# Patient Record
Sex: Female | Born: 1995 | Race: White | Hispanic: No | Marital: Married | Smoking: Never smoker
Health system: Southern US, Community
[De-identification: ages and names within clinical notes are randomized; demographics above are authoritative.]

## PROBLEM LIST (undated history)

## (undated) DIAGNOSIS — F909 Attention-deficit hyperactivity disorder, unspecified type: Secondary | ICD-10-CM

---
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
## 2016-01-08 ENCOUNTER — Encounter (HOSPITAL_BASED_OUTPATIENT_CLINIC_OR_DEPARTMENT_OTHER): Payer: Self-pay | Admitting: *Deleted

## 2016-01-08 ENCOUNTER — Emergency Department (HOSPITAL_BASED_OUTPATIENT_CLINIC_OR_DEPARTMENT_OTHER)
Admission: EM | Admit: 2016-01-08 | Discharge: 2016-01-08 | Disposition: A | Discharge status: Home / Self Care | Payer: 59 | Attending: Emergency Medicine | Admitting: Emergency Medicine

## 2016-01-08 DIAGNOSIS — N390 Urinary tract infection, site not specified: Secondary | ICD-10-CM

## 2016-01-08 DIAGNOSIS — Z79899 Other long term (current) drug therapy: Secondary | ICD-10-CM | POA: Diagnosis not present

## 2016-01-08 DIAGNOSIS — R112 Nausea with vomiting, unspecified: Secondary | ICD-10-CM | POA: Diagnosis present

## 2016-01-08 DIAGNOSIS — F909 Attention-deficit hyperactivity disorder, unspecified type: Secondary | ICD-10-CM | POA: Diagnosis not present

## 2016-01-08 LAB — URINALYSIS, ROUTINE W REFLEX MICROSCOPIC
Bilirubin Urine: NEGATIVE
GLUCOSE, UA: NEGATIVE mg/dL
Hgb urine dipstick: NEGATIVE
KETONES UR: NEGATIVE mg/dL
NITRITE: NEGATIVE
PH: 8.5 — AB (ref 5.0–8.0)
Protein, ur: 30 mg/dL — AB
SPECIFIC GRAVITY, URINE: 1.028 (ref 1.005–1.030)

## 2016-01-08 LAB — URINE MICROSCOPIC-ADD ON

## 2016-01-08 LAB — CBC
HEMATOCRIT: 42.8 % (ref 36.0–46.0)
HEMOGLOBIN: 15 g/dL (ref 12.0–15.0)
MCH: 30.4 pg (ref 26.0–34.0)
MCHC: 35 g/dL (ref 30.0–36.0)
MCV: 86.8 fL (ref 78.0–100.0)
Platelets: 304 10*3/uL (ref 150–400)
RBC: 4.93 MIL/uL (ref 3.87–5.11)
RDW: 13.1 % (ref 11.5–15.5)
WBC: 14.6 10*3/uL — ABNORMAL HIGH (ref 4.0–10.5)

## 2016-01-08 LAB — BASIC METABOLIC PANEL
Anion gap: 11 (ref 5–15)
BUN: 17 mg/dL (ref 6–20)
CHLORIDE: 105 mmol/L (ref 101–111)
CO2: 23 mmol/L (ref 22–32)
CREATININE: 0.55 mg/dL (ref 0.44–1.00)
Calcium: 9.1 mg/dL (ref 8.9–10.3)
GFR calc Af Amer: >60 mL/min (ref >60)
GFR calc non Af Amer: >60 mL/min (ref >60)
Glucose, Bld: 99 mg/dL (ref 65–99)
POTASSIUM: 4 mmol/L (ref 3.5–5.1)
SODIUM: 139 mmol/L (ref 135–145)

## 2016-01-08 LAB — PREGNANCY, URINE: Preg Test, Ur: NEGATIVE

## 2016-01-08 MED ORDER — SODIUM CHLORIDE 0.9 % IV BOLUS (SEPSIS)
1000.0000 mL | Freq: Once | INTRAVENOUS | Status: AC
  Administered 2016-01-08: 1000 mL via INTRAVENOUS

## 2016-01-08 MED ORDER — ONDANSETRON HCL 4 MG PO TABS: 4.0000 mg | ORAL_TABLET | Freq: Four times a day (QID) | ORAL | Status: AC

## 2016-01-08 MED ORDER — CIPROFLOXACIN IN D5W 400 MG/200ML IV SOLN
400.0000 mg | Freq: Once | INTRAVENOUS | Status: AC
  Administered 2016-01-08: 400 mg via INTRAVENOUS
  Filled 2016-01-08: qty 200

## 2016-01-08 MED ORDER — ACETAMINOPHEN 325 MG PO TABS
650.0000 mg | ORAL_TABLET | Freq: Once | ORAL | Status: AC
  Administered 2016-01-08: 650 mg via ORAL
  Filled 2016-01-08: qty 2

## 2016-01-08 MED ORDER — KETOROLAC TROMETHAMINE 30 MG/ML IJ SOLN
30.0000 mg | Freq: Once | INTRAMUSCULAR | Status: AC
  Administered 2016-01-08: 30 mg via INTRAVENOUS
  Filled 2016-01-08: qty 1

## 2016-01-08 MED ORDER — PHENAZOPYRIDINE HCL 200 MG PO TABS: 200.0000 mg | ORAL_TABLET | Freq: Three times a day (TID) | ORAL | Status: AC

## 2016-01-08 MED ORDER — CIPROFLOXACIN HCL 500 MG PO TABS: 500.0000 mg | ORAL_TABLET | Freq: Two times a day (BID) | ORAL | Status: AC

## 2016-01-08 MED ORDER — ONDANSETRON HCL 4 MG/2ML IJ SOLN
4.0000 mg | Freq: Once | INTRAMUSCULAR | Status: AC
  Administered 2016-01-08: 4 mg via INTRAVENOUS
  Filled 2016-01-08: qty 2

## 2016-01-08 MED ORDER — ONDANSETRON 8 MG PO TBDP: 8.0000 mg | ORAL_TABLET | Freq: Once | ORAL | Status: DC

## 2016-01-08 NOTE — Discharge Instructions (Signed)
Your symptoms are likely due to a urinary tract infection. Please take your antibiotics as prescribed. You may use your Pyridium for the urinary burning. Please stay well hydrated and drink lots of water and/or Gatorade. Return to ED for any new or worsening symptoms.  Urinary Tract Infection Urinary tract infections (UTIs) can develop anywhere along your urinary tract. Your urinary tract is your body's drainage system for removing wastes and extra water. Your urinary tract includes two kidneys, two ureters, a bladder, and a urethra. Your kidneys are a pair of bean-shaped organs. Each kidney is about the size of your fist. They are located below your ribs, one on each side of your spine. CAUSES Infections are caused by microbes, which are microscopic organisms, including fungi, viruses, and bacteria. These organisms are so small that they can only be seen through a microscope. Bacteria are the microbes that most commonly cause UTIs. SYMPTOMS  Symptoms of UTIs may vary by age and gender of the patient and by the location of the infection. Symptoms in young women typically include a frequent and intense urge to urinate and a painful, burning feeling in the bladder or urethra during urination. Older women and men are more likely to be tired, shaky, and weak and have muscle aches and abdominal pain. A fever may mean the infection is in your kidneys. Other symptoms of a kidney infection include pain in your back or sides below the ribs, nausea, and vomiting. DIAGNOSIS To diagnose a UTI, your caregiver will ask you about your symptoms. Your caregiver will also ask you to provide a urine sample. The urine sample will be tested for bacteria and white blood cells. White blood cells are made by your body to help fight infection. TREATMENT  Typically, UTIs can be treated with medication. Because most UTIs are caused by a bacterial infection, they usually can be treated with the use of antibiotics. The choice of  antibiotic and length of treatment depend on your symptoms and the type of bacteria causing your infection. HOME CARE INSTRUCTIONS  If you were prescribed antibiotics, take them exactly as your caregiver instructs you. Finish the medication even if you feel better after you have only taken some of the medication.  Drink enough water and fluids to keep your urine clear or pale yellow.  Avoid caffeine, tea, and carbonated beverages. They tend to irritate your bladder.  Empty your bladder often. Avoid holding urine for long periods of time.  Empty your bladder before and after sexual intercourse.  After a bowel movement, women should cleanse from front to back. Use each tissue only once. SEEK MEDICAL CARE IF:   You have back pain.  You develop a fever.  Your symptoms do not begin to resolve within 3 days. SEEK IMMEDIATE MEDICAL CARE IF:   You have severe back pain or lower abdominal pain.  You develop chills.  You have nausea or vomiting.  You have continued burning or discomfort with urination. MAKE SURE YOU:   Understand these instructions.  Will watch your condition.  Will get help right away if you are not doing well or get worse.   This information is not intended to replace advice given to you by your health care provider. Make sure you discuss any questions you have with your health care provider.   Document Released: 06/06/2005 Document Revised: 05/18/2015 Document Reviewed: 10/05/2011 Elsevier Interactive Patient Education [employer]2016 [employer]

## 2016-01-08 NOTE — ED Provider Notes (Signed)
CSN: [id number]     Arrival date & time 01/08/16  1408 History   First MD Initiated Contact with Patient 01/08/16 [phone]     Chief Complaint  Patient presents with  . Emesis     (Consider location/radiation/quality/duration/timing/severity/associated sxs/prior Treatment) HPI Kathryn Snow is a 20 y.o. female who comes in for evaluation of nausea, vomiting and dysuria. Patient reports yesterday she began to develop dysuria, urinary hesitancy. She reports symptoms worsen this morning at 3:00 AM at which time she developed nausea and vomiting. Emesis is nonbloody and nonbilious. She reports lower abdominal discomfort. Reports she is just now coming off of her menstrual cycle, which was normal for her. Denies any fevers at home. She does report that her family had the flu last week. She reports mild mid back pain. No other abdominal surgeries. No other alleviating or modifying factors.  Past Medical History  Diagnosis Date  . Attention deficit disorder with hyperactivity    History reviewed. No pertinent past surgical history. No family history on file. Social History  Substance Use Topics  . Smoking status: Never Smoker   . Smokeless tobacco: None  . Alcohol Use: No   OB History    No data available     Review of Systems A 10 point review of systems was completed and was negative except for pertinent positives and negatives as mentioned in the history of present illness     Allergies  Penicillins  Home Medications   Prior to Admission medications   Medication Sig Start Date End Date Taking? Authorizing Provider  Sertraline HCl (ZOLOFT PO) Take by mouth daily.   Yes Historical Provider, MD  acetaminophen (TYLENOL) 325 MG tablet Take 1,000 mg by mouth every 6 (six) hours as needed.    Historical Provider, MD  ciprofloxacin (CIPRO) 500 MG tablet Take 1 tablet (500 mg total) by mouth 2 (two) times daily. 01/08/16   Joycie Peek, PA-C  ondansetron (ZOFRAN) 4 MG tablet Take  1 tablet (4 mg total) by mouth every 6 (six) hours. 01/08/16   Joycie Peek, PA-C  OVER THE COUNTER MEDICATION Birth control pills    Historical Provider, MD  phenazopyridine (PYRIDIUM) 200 MG tablet Take 1 tablet (200 mg total) by mouth 3 (three) times daily. 01/08/16   Joycie Peek, PA-C  prochlorperazine (COMPAZINE) 10 MG tablet Take 10 mg by mouth every 6 (six) hours as needed for nausea or vomiting.    Historical Provider, MD   BP 110/46 mmHg  Pulse 105  Temp(Src) 98.6 F (37 C) (Oral)  Resp 15  Ht  (1.676 m)  Wt 90.719 kg  BMI 32.30 kg/m2  SpO2 99% Physical Exam  Constitutional: She is oriented to person, place, and time. She appears well-developed and well-nourished.  HENT:  Head: Normocephalic and atraumatic.  Mouth/Throat: Oropharynx is clear and moist.  Eyes: Conjunctivae are normal. Pupils are equal, round, and reactive to light. Right eye exhibits no discharge. Left eye exhibits no discharge. No scleral icterus.  Neck: Neck supple.  Cardiovascular: Normal rate, regular rhythm and normal heart sounds.   Pulmonary/Chest: Effort normal and breath sounds normal. No respiratory distress. She has no wheezes. She has no rales.  Abdominal: Soft.  Moderate suprapubic tenderness. Abdomen is otherwise soft, nondistended without rebound or guarding. No other lesions or deformities. No CVA tenderness. No rashes.  Musculoskeletal: She exhibits no tenderness.  Neurological: She is alert and oriented to person, place, and time.  Cranial Nerves II-XII grossly intact  Skin:  Skin is warm and dry. No rash noted.  Psychiatric: She has a normal mood and affect.  Nursing note and vitals reviewed.   ED Course  Procedures (including critical care time) Labs Review Labs Reviewed  URINALYSIS, ROUTINE W REFLEX MICROSCOPIC ([facility]) - Abnormal; Notable for the following:    pH 8.5 (*)    Protein, ur 30 (*)    Leukocytes, UA SMALL (*)    All other components within normal limits   CBC - Abnormal; Notable for the following:    WBC 14.6 (*)    All other components within normal limits  URINE MICROSCOPIC-ADD ON - Abnormal; Notable for the following:    Squamous Epithelial / LPF 0-5 (*)    Bacteria, UA MANY (*)    All other components within normal limits  URINE CULTURE  PREGNANCY, URINE  BASIC METABOLIC PANEL    Imaging Review No results found. I have personally reviewed and evaluated these images and lab results as part of my medical decision-making.   EKG Interpretation   Date/Time:  Sunday January 08 2016 15:27:39 EDT Ventricular Rate:  114 PR Interval:  129 QRS Duration: 84 QT Interval:  322 QTC Calculation: 443 R Axis:   56 Text Interpretation:  Sinus tachycardia Otherwise normal ECG No old  tracing to compare Confirmed by GOLDSTON  MD, SCOTT [phone]([id number]) on 01/08/2016  3:29:26 PM     Meds given in ED:  Medications  ondansetron (ZOFRAN) injection 4 mg (4 mg Intravenous Given 01/08/16 1453)  sodium chloride 0.9 % bolus 1,000 mL (0 mLs Intravenous Stopped 01/08/16 1618)  sodium chloride 0.9 % bolus 1,000 mL (0 mLs Intravenous Stopped 01/08/16 1800)  ciprofloxacin (CIPRO) IVPB 400 mg (0 mg Intravenous Stopped 01/08/16 1827)  ketorolac (TORADOL) 30 MG/ML injection 30 mg (30 mg Intravenous Given 01/08/16 1756)  acetaminophen (TYLENOL) tablet 650 mg (650 mg Oral Given 01/08/16 1755)  sodium chloride 0.9 % bolus 1,000 mL (1,000 mLs Intravenous New Bag/Given 01/08/16 1802)    New Prescriptions   CIPROFLOXACIN (CIPRO) 500 MG TABLET    Take 1 tablet (500 mg total) by mouth 2 (two) times daily.   ONDANSETRON (ZOFRAN) 4 MG TABLET    Take 1 tablet (4 mg total) by mouth every 6 (six) hours.   PHENAZOPYRIDINE (PYRIDIUM) 200 MG TABLET    Take 1 tablet (200 mg total) by mouth 3 (three) times daily.   Filed Vitals:   01/08/16 1637 01/08/16 1736 01/08/16 1916 01/08/16 1916  BP: 126/64  110/46   Pulse: 108  105   Temp:  100 F (37.8 C)  98.6 F (37 C)  TempSrc:  Oral   Oral  Resp: 18  15   Height:      Weight:      SpO2: 100%  99%     MDM  Pt has been diagnosed with a UTI. She does report some mild back pain but does not have any CVA tenderness. Patient does have mild tachycardia, given fluid bolus and she reports she feels better. Pt is afebrile, normotensive, and has had no nausea or vomiting in the emergency department. Tolerating oral fluids. However, we'll treat for possible early pyelonephritis. Urine culture obtained. Given Cipro IV and emergent department, will DC with oral medication. Also given nausea medication. Discussed aggressive oral rehydration. Pt to be dc home with antibiotics and instructions to follow up with PCP if symptoms persist. Discussed strict return precautions, patient and family bedside verbalize understanding and agree with plan. No  evidence of other acute emergent pathology at this time, appropriate for discharge.  Final diagnoses:  UTI (lower urinary tract infection)        Joycie Peek, PA-C 01/08/16 1952  Pricilla Loveless, MD 01/12/16 [phone]

## 2016-01-08 NOTE — ED Notes (Addendum)
Patient c/o vomiting & painful urination for the past 24 hours. She states she is dehydrated due to vomiting

## 2016-01-08 NOTE — ED Notes (Signed)
Pt and mother given d/c instructions as per chart. Verbalizes understanding. No questions. Rx x 3

## 2016-01-10 LAB — URINE CULTURE

## 2017-05-29 ENCOUNTER — Encounter (HOSPITAL_COMMUNITY): Payer: Self-pay | Admitting: *Deleted

## 2017-05-29 ENCOUNTER — Emergency Department (HOSPITAL_COMMUNITY)
Admission: EM | Admit: 2017-05-29 | Discharge: 2017-05-29 | Disposition: A | Discharge status: Home / Self Care | Payer: 59 | Attending: Physician Assistant | Admitting: Physician Assistant

## 2017-05-29 ENCOUNTER — Emergency Department (HOSPITAL_COMMUNITY): Payer: 59

## 2017-05-29 DIAGNOSIS — Y998 Other external cause status: Secondary | ICD-10-CM | POA: Insufficient documentation

## 2017-05-29 DIAGNOSIS — Z23 Encounter for immunization: Secondary | ICD-10-CM | POA: Diagnosis not present

## 2017-05-29 DIAGNOSIS — Y9389 Activity, other specified: Secondary | ICD-10-CM | POA: Diagnosis not present

## 2017-05-29 DIAGNOSIS — Z79899 Other long term (current) drug therapy: Secondary | ICD-10-CM | POA: Diagnosis not present

## 2017-05-29 DIAGNOSIS — S61412A Laceration without foreign body of left hand, initial encounter: Secondary | ICD-10-CM | POA: Diagnosis not present

## 2017-05-29 DIAGNOSIS — Y929 Unspecified place or not applicable: Secondary | ICD-10-CM | POA: Insufficient documentation

## 2017-05-29 DIAGNOSIS — W25XXXA Contact with sharp glass, initial encounter: Secondary | ICD-10-CM | POA: Diagnosis not present

## 2017-05-29 DIAGNOSIS — F909 Attention-deficit hyperactivity disorder, unspecified type: Secondary | ICD-10-CM | POA: Diagnosis not present

## 2017-05-29 MED ORDER — LIDOCAINE HCL 2 % IJ SOLN
10.0000 mL | Freq: Once | INTRAMUSCULAR | Status: DC
  Filled 2017-05-29: qty 20

## 2017-05-29 MED ORDER — OXYCODONE-ACETAMINOPHEN 5-325 MG PO TABS
ORAL_TABLET | ORAL | Status: AC
  Filled 2017-05-29: qty 1

## 2017-05-29 MED ORDER — TETANUS-DIPHTH-ACELL PERTUSSIS 5-2.5-18.5 LF-MCG/0.5 IM SUSP
0.5000 mL | Freq: Once | INTRAMUSCULAR | Status: AC
  Administered 2017-05-29: 0.5 mL via INTRAMUSCULAR
  Filled 2017-05-29: qty 0.5

## 2017-05-29 MED ORDER — LIDOCAINE VISCOUS 2 % MT SOLN
15.0000 mL | Freq: Once | OROMUCOSAL | Status: DC
  Filled 2017-05-29: qty 15

## 2017-05-29 MED ORDER — CLINDAMYCIN HCL 300 MG PO CAPS: 300.0000 mg | ORAL_CAPSULE | Freq: Four times a day (QID) | ORAL | 0 refills | Status: AC

## 2017-05-29 MED ORDER — OXYCODONE-ACETAMINOPHEN 5-325 MG PO TABS
1.0000 | ORAL_TABLET | ORAL | Status: DC | PRN
  Administered 2017-05-29: 1 via ORAL

## 2017-05-29 MED ORDER — CLINDAMYCIN HCL 300 MG PO CAPS: 300.0000 mg | ORAL_CAPSULE | Freq: Four times a day (QID) | ORAL | 0 refills | Status: DC

## 2017-05-29 NOTE — Discharge Instructions (Signed)
Please see the information and instructions below regarding your visit.  Your diagnoses today include:  1. Laceration of left hand without foreign body, initial encounter      Tests performed today include: See side panel of your discharge paperwork for testing performed today. Vital signs are listed at the bottom of these instructions.   Medications prescribed:    Clindamycin. Please take all of your antibiotics until finished.   You may develop abdominal discomfort or nausea from the antibiotic. If this occurs, you may take it with food. Some patients also get diarrhea with antibiotics. You may help offset this with probiotics which you can buy or get in yogurt. Do not eat or take the probiotics until 2 hours after your antibiotic. Some women develop vaginal yeast infections after antibiotics. If you develop unusual vaginal discharge after being on this medication, please see your primary care provider.   Some people develop allergies to antibiotics. Symptoms of antibiotic allergy can be mild and include a flat rash and itching. They can also be more serious and include:  ?Hives - Hives are raised, red patches of skin that are usually very itchy.  ?Lip or tongue swelling  ?Trouble swallowing or breathing  ?Blistering of the skin or mouth.  If you have any of these serious symptoms, please seek emergency medical care immediately.    Take any prescribed medications only as prescribed, and any over the counter medications only as directed on the packaging.  Home care instructions:  Please follow any educational materials contained in this packet.   Follow-up instructions: Please follow-up with your primary care provider for further evaluation of your symptoms if they are not completely improved.   Please follow up with orthopedics  Return instructions:  Please return to the Emergency Department if you experience worsening symptoms.  Please return if you have any other emergent  concerns.   Your vital signs today were: BP 138/84 (BP Location: Right Arm)    Pulse 94    Temp 98.4 F (36.9 C) (Oral)    Resp 18    LMP 05/28/2017 Comment: patient shielded   SpO2 98%  If your blood pressure (BP) was elevated on multiple readings during this visit above 130 for the top number or above 80 for the bottom number, please have this repeated by your primary care provider within one month. --------------  Thank you for allowing [place] to participate in your care today.

## 2017-05-29 NOTE — ED Triage Notes (Signed)
Pt reports falling and tried to break her fall on glass table. Has approx one inch laceration to left palm, bleeding controlled. Reports multiple small lacerations/abrasions to right hand and possibly her back.

## 2017-05-29 NOTE — ED Notes (Signed)
Patient transported to X-ray 

## 2017-05-29 NOTE — ED Provider Notes (Signed)
[facility]-EMERGENCY DEPT Provider Note   CSN: [id number] Arrival date & time: 05/29/17  1800     History   Chief Complaint Chief Complaint  Patient presents with  . Laceration    HPI Kathryn Snow is a 21 y.o. female.  HPI   Patient is a 21 year old female with no significant past medical history presenting for a laceration of her left palm following an incident of falling on a broken glass table. Patient per she was sitting on the table when it broke and she caught herself. Patient did not lose consciousness or hit her head. Patient had no presyncope, dizziness, or lightheadedness. Patient reports decreased sensation in her left thumb, but denies any weakness in her hand or bony tenderness. Patient does not recall when last tetanus shot was.  Past Medical History:  Diagnosis Date  . Attention deficit disorder with hyperactivity     There are no active problems to display for this patient.   History reviewed. No pertinent surgical history.  OB History    No data available       Home Medications    Prior to Admission medications   Medication Sig Start Date End Date Taking? Authorizing Provider  acetaminophen (TYLENOL) 325 MG tablet Take 1,000 mg by mouth every 6 (six) hours as needed.    [provider]  ciprofloxacin (CIPRO) 500 MG tablet Take 1 tablet (500 mg total) by mouth 2 (two) times daily. 01/08/16   Cartner, Sharlet Salina, PA-C  ondansetron (ZOFRAN) 4 MG tablet Take 1 tablet (4 mg total) by mouth every 6 (six) hours. 01/08/16   Cartner, Sharlet Salina, PA-C  OVER THE COUNTER MEDICATION Birth control pills    [provider]  phenazopyridine (PYRIDIUM) 200 MG tablet Take 1 tablet (200 mg total) by mouth 3 (three) times daily. 01/08/16   Cartner, Sharlet Salina, PA-C  prochlorperazine (COMPAZINE) 10 MG tablet Take 10 mg by mouth every 6 (six) hours as needed for nausea or vomiting.    [provider]  Sertraline HCl (ZOLOFT PO) Take by mouth daily.     [provider]    Family History History reviewed. No pertinent family history.  Social History Social History  Substance Use Topics  . Smoking status: Never Smoker  . Smokeless tobacco: Not on file  . Alcohol use No     Allergies   Penicillins   Review of Systems Review of Systems  Gastrointestinal: Positive for nausea. Negative for vomiting.  Musculoskeletal: Positive for joint swelling.  Skin: Positive for wound.  Neurological: Negative for dizziness, syncope and light-headedness.     Physical Exam Updated Vital Signs BP 138/84 (BP Location: Right Arm)   Pulse 94   Temp 98.4 F (36.9 C) (Oral)   Resp 18   LMP 05/28/2017 Comment: patient shielded  SpO2 98%   Physical Exam  Constitutional: She appears well-developed and well-nourished. No distress.  Sitting comfortably in bed.  HENT:  Head: Normocephalic and atraumatic.  Eyes: Conjunctivae are normal. Right eye exhibits no discharge. Left eye exhibits no discharge.  EOMs normal to gross examination.  Neck: Normal range of motion.  Cardiovascular: Normal rate and regular rhythm.   Intact, 2+ radial pulse.  Pulmonary/Chest:  Normal respiratory effort. Patient converses comfortably. No audible wheeze or stridor.  Abdominal: She exhibits no distension.  Musculoskeletal:  Patient has full range of motion of the left thumb. Examination of the left thumb after anesthetizing wound demonstrates no laxity of the ulnar collateral ligament with Abduction. Patient has strong  pincer grip of the left thumb and 5 out of 5 strength with resistance to flexion, extension, opposition, and abduction.  Neurological: She is alert.  Cranial nerves intact to gross observation. Patient moves extremities without difficulty.  Skin: Skin is warm and dry. She is not diaphoretic.  2 cm laceration of the left palm over the thenar eminence. No extension to the fascial layer. No foreign body identified on exploration. Multiple  superficial abrasions noted on bilateral hands.   Psychiatric: She has a normal mood and affect. Her behavior is normal. Judgment and thought content normal.  Nursing note and vitals reviewed.    ED Treatments / Results  Labs (all labs ordered are listed, but only abnormal results are displayed) Labs Reviewed - No data to display  EKG  EKG Interpretation None       Radiology Dg Hand Complete Left  Result Date: 05/29/2017 CLINICAL DATA:  Patient fell through glass table. Laceration to the left palm. Multiple lacerations to the right hand. EXAM: LEFT HAND - COMPLETE 3+ VIEW COMPARISON:  None. FINDINGS: There is no evidence of fracture or dislocation. There is no evidence of arthropathy or other focal bone abnormality. Soft tissues are unremarkable. No radiopaque foreign bodies identified. IMPRESSION: No acute bony abnormalities. No radiopaque soft tissue foreign bodies. Electronically Signed   By: Burman Nieves M.D.   On: 05/29/2017 21:09    Procedures .Marland KitchenLaceration Repair Date/Time: 05/29/2017 11:07 PM Performed by: Elisha Ponder Authorized by: Aviva Kluver B   Consent:    Consent obtained:  Verbal   Consent given by:  Patient   Risks discussed:  Infection, pain, poor wound healing and need for additional repair Anesthesia (see MAR for exact dosages):    Anesthesia method:  Local infiltration   Local anesthetic:  Lidocaine 2% w/o epi Laceration details:    Location:  Hand   Hand location:  R palm   Wound length (cm): 2 cm.   Laceration depth: 0.5 cm. Repair type:    Repair type:  Simple Pre-procedure details:    Preparation:  Patient was prepped and draped in usual sterile fashion and imaging obtained to evaluate for foreign bodies Exploration:    Hemostasis achieved with:  Direct pressure   Wound exploration: wound explored through full range of motion and entire depth of wound probed and visualized     Contaminated: no   Treatment:    Area cleansed with:   Betadine   Amount of cleaning:  Standard   Irrigation solution:  Sterile saline   Irrigation volume:  420 ml   Visualized foreign bodies/material removed: no   Skin repair:    Repair method:  Sutures   Suture size:  4-0   Suture material:  Nylon   Suture technique:  Simple interrupted   Number of sutures:  7 Approximation:    Approximation:  Close   Vermilion border: well-aligned   Post-procedure details:    Dressing:  Non-adherent dressing   Patient tolerance of procedure:  Tolerated well, no immediate complications   (including critical care time)  Medications Ordered in ED Medications  oxyCODONE-acetaminophen (PERCOCET/ROXICET) 5-325 MG per tablet 1 tablet (1 tablet Oral Given 05/29/17 1857)  oxyCODONE-acetaminophen (PERCOCET/ROXICET) 5-325 MG per tablet (not administered)  lidocaine (XYLOCAINE) 2 % viscous mouth solution 15 mL (not administered)  lidocaine (XYLOCAINE) 2 % (with pres) injection 200 mg (not administered)     Initial Impression / Assessment and Plan / ED Course  I have reviewed the triage vital signs  and the nursing notes.  Pertinent labs & imaging results that were available during my care of the patient were reviewed by me and considered in my medical decision making (see chart for details).      Final Clinical Impressions(s) / ED Diagnoses   Final diagnoses:  None   MDM  Patient is a 21 year old female with no significant past medical history presenting for a laceration of her left palm following an incident of falling on a broken glass table. Patient is neurovascularly intact in her left hand. Patient demonstrates no laxity of the ulnar collateral ligament. X-ray of the left hand demonstrates no foreign body or avulsion fractures. Patient sutured in the emergency department today and given strict follow-up for any drainage, increased swelling, increased erythema, or decreased range of motion or pain in the left thumb. Patient referred to [facility] if needed for further evaluation. Patient given prescription prophylactically for clindamycin 5 day course as opposed to Keflex due to allergy producing hives to penicillin.   Thumb spica velcro splint ordered for patient to receive prior to discharge. Patient and patient's mother requested rapid discharge and patient ultimately left before receiving this splint.  Nursing notes reviewed. Vital signs reviewed. All questions answered by patient and family.  New.  Prescriptions New Prescriptions   No medications on file         Delia Chimes 05/30/17 2952    Abelino Derrick, MD 06/02/17 1512

## 2018-02-12 IMAGING — DX DG HAND COMPLETE 3+V*L*
3 series · 3 of 3 positions shown · non-contrast
Comparison: None.

CLINICAL DATA: Patient fell through glass table. Laceration to the
left palm. Multiple lacerations to the right hand.

EXAM:
LEFT HAND - COMPLETE 3+ VIEW

[hand pa]
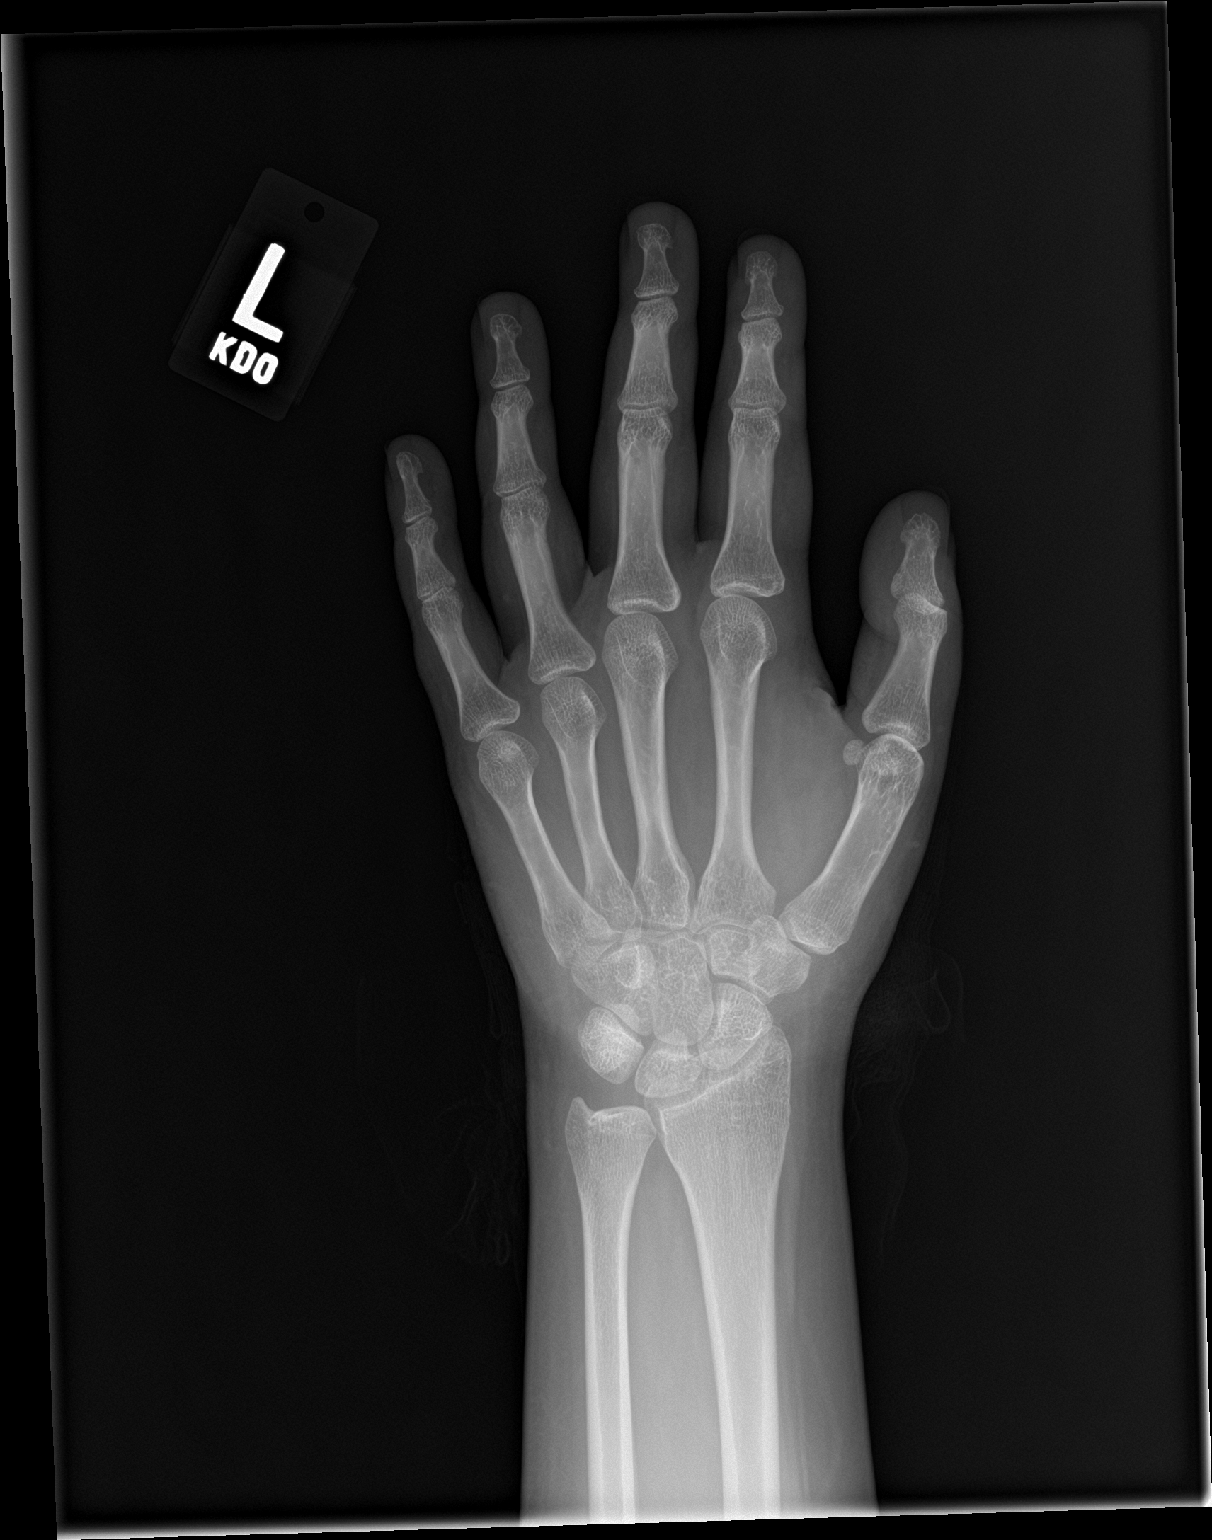

[hand obl]
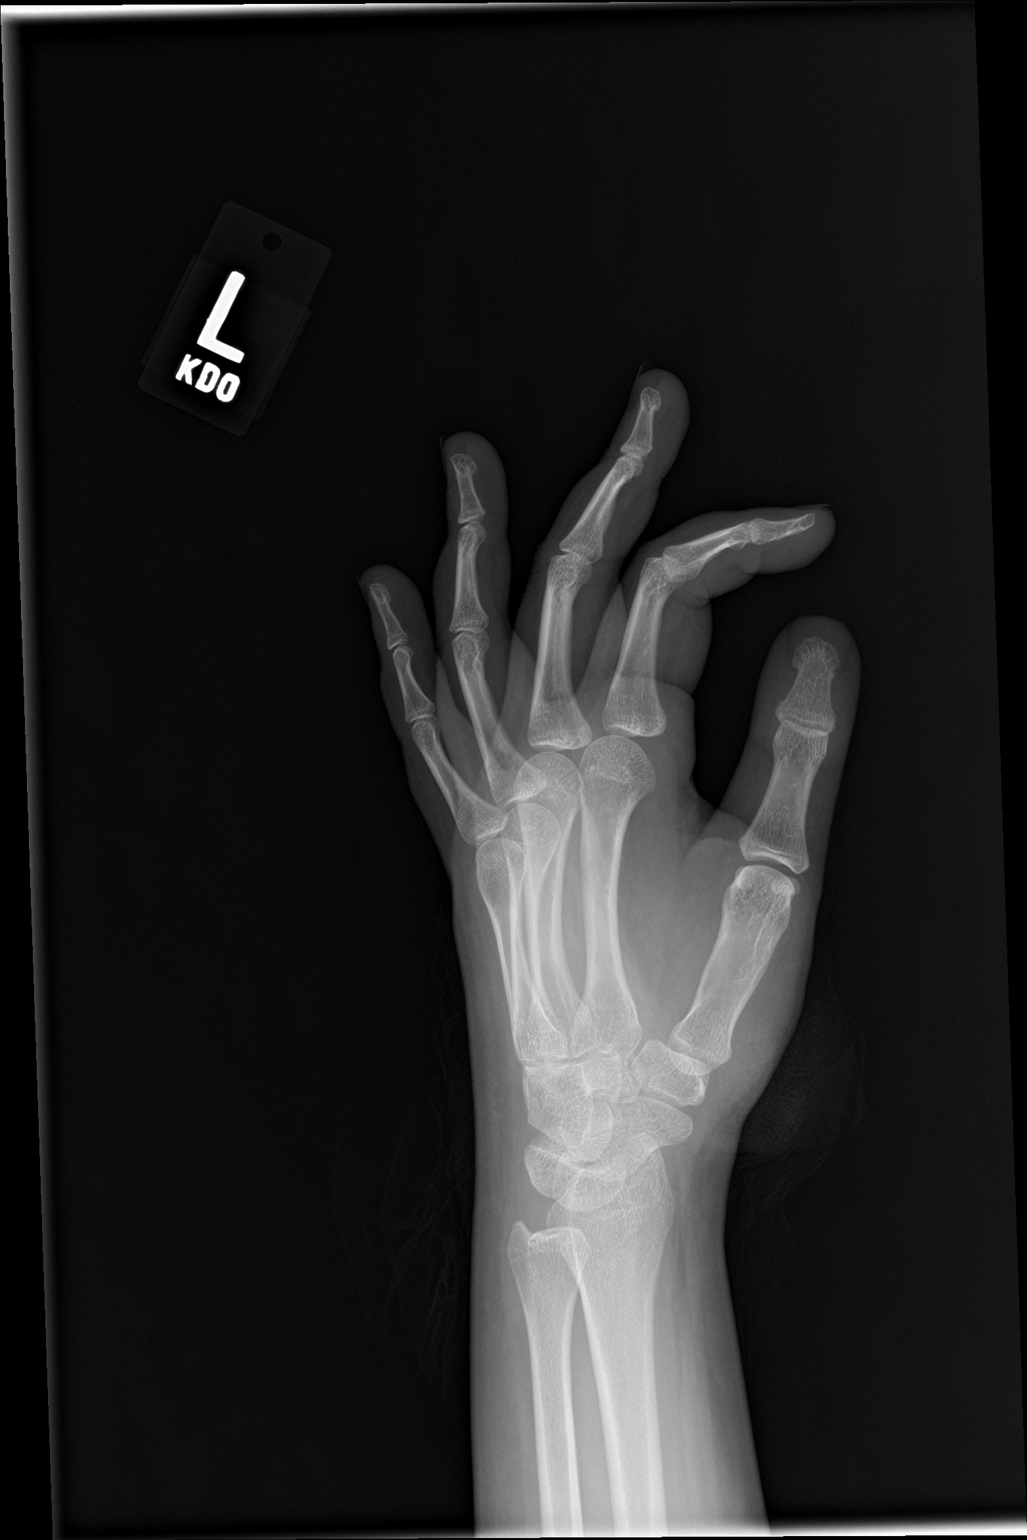

[hand lat]
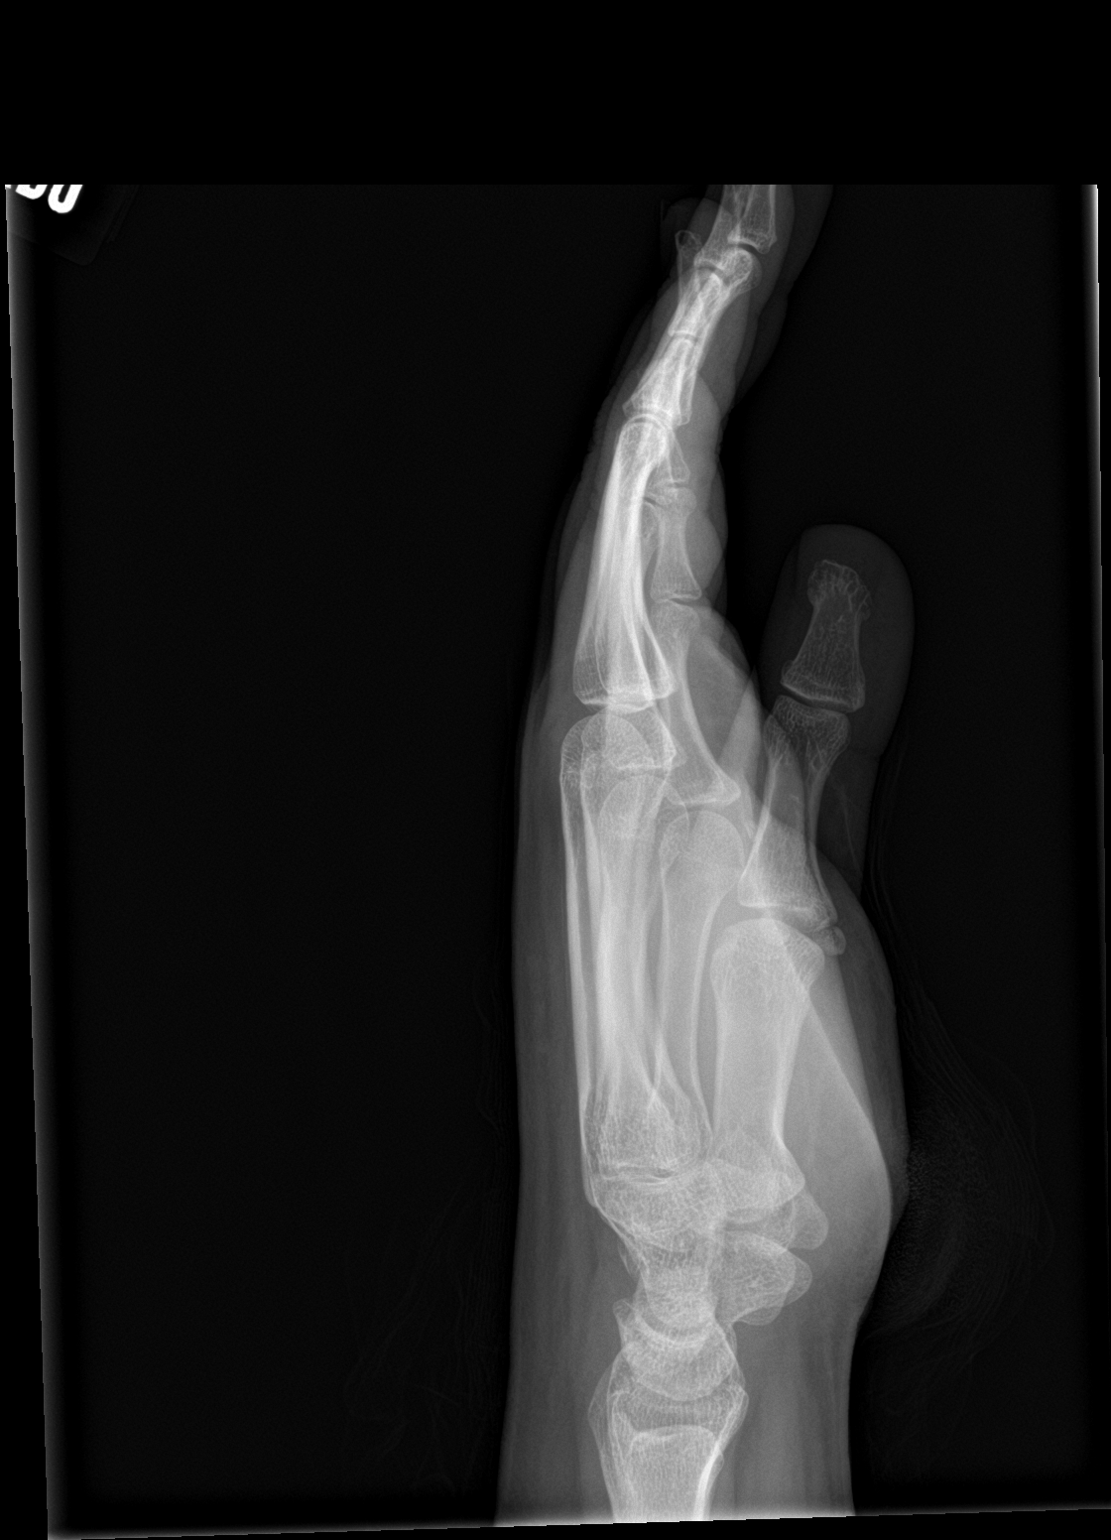

[3 of 3 positions shown; findings below may reference images not displayed]

FINDINGS: There is no evidence of fracture or dislocation. There is no
evidence of arthropathy or other focal bone abnormality. Soft
tissues are unremarkable. No radiopaque foreign bodies identified.
IMPRESSION: No acute bony abnormalities. No radiopaque soft tissue foreign
bodies.

## 2018-06-06 ENCOUNTER — Encounter: Payer: Self-pay | Admitting: *Deleted

## 2018-06-06 DIAGNOSIS — F812 Mathematics disorder: Secondary | ICD-10-CM

## 2018-06-06 DIAGNOSIS — F429 Obsessive-compulsive disorder, unspecified: Secondary | ICD-10-CM | POA: Insufficient documentation

## 2018-06-06 DIAGNOSIS — F331 Major depressive disorder, recurrent, moderate: Secondary | ICD-10-CM | POA: Insufficient documentation

## 2018-06-06 DIAGNOSIS — F902 Attention-deficit hyperactivity disorder, combined type: Secondary | ICD-10-CM

## 2018-06-06 DIAGNOSIS — F4010 Social phobia, unspecified: Secondary | ICD-10-CM | POA: Insufficient documentation

## 2018-06-06 DIAGNOSIS — F88 Other disorders of psychological development: Secondary | ICD-10-CM | POA: Insufficient documentation

## 2018-06-16 ENCOUNTER — Ambulatory Visit: Payer: Self-pay | Admitting: Psychiatry

## 2018-06-27 ENCOUNTER — Other Ambulatory Visit: Payer: Self-pay

## 2018-06-27 MED ORDER — VORTIOXETINE HBR 10 MG PO TABS: 10.0000 mg | ORAL_TABLET | Freq: Every day | ORAL | 0 refills | Status: AC

## 2018-06-30 ENCOUNTER — Telehealth: Payer: Self-pay | Admitting: Psychiatry

## 2018-06-30 DIAGNOSIS — F331 Major depressive disorder, recurrent, moderate: Secondary | ICD-10-CM

## 2018-06-30 MED ORDER — CARIPRAZINE HCL 1.5 MG PO CAPS: 1.5000 mg | ORAL_CAPSULE | Freq: Every day | ORAL | 0 refills | Status: AC

## 2018-06-30 MED ORDER — VORTIOXETINE HBR 10 MG PO TABS: 10.0000 mg | ORAL_TABLET | Freq: Every day | ORAL | 0 refills | Status: AC

## 2018-06-30 NOTE — Telephone Encounter (Signed)
Patient and grandmother call that they are out of medications needing samples until PA complete preparing 4-week supply for pickup.

## 2018-07-16 ENCOUNTER — Ambulatory Visit (INDEPENDENT_AMBULATORY_CARE_PROVIDER_SITE_OTHER): Payer: 59 | Admitting: Licensed Clinical Social Worker

## 2018-07-16 DIAGNOSIS — F603 Borderline personality disorder: Secondary | ICD-10-CM

## 2018-07-22 ENCOUNTER — Encounter (HOSPITAL_COMMUNITY): Payer: Self-pay | Admitting: Licensed Clinical Social Worker
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
## 2018-07-23 ENCOUNTER — Ambulatory Visit (HOSPITAL_COMMUNITY): Payer: 59 | Admitting: Licensed Clinical Social Worker

## 2018-07-23 ENCOUNTER — Encounter (HOSPITAL_COMMUNITY): Payer: Self-pay | Admitting: Licensed Clinical Social Worker

## 2018-07-23 NOTE — Progress Notes (Signed)
  Weekly Group Progress Note  Program: OUTPATIENT SKILLS GROUP  Group Time: 5:30-6:30pm  Participation Level: Minimal  Behavioral Response: Passive-Aggressive, Resistant and Agitated  Type of Therapy:  Psycho-education Group  Skills discussed: Distinguishing "Thoughts vs. Feelings", Mindfulness.   Summary of Progress: Pt was minimally engaged in her first group. She appeared significantly agitated, shaking her leg throughout session. She made intermittent eye contact and struggled to articulate her feelings. Pt reports she has "heard all this stuff" but will return for next session.   Summary of Group: Pts were active and engaged in psychoeducation DBT-based group. Counselor asked pts to share about their recovery from mind-altering chemicals, debilitating anxiety and depression. Pt's were given a handout on "Thoughts vs. Feelings" and discussed how to distinguish emotions and thoughts.    Margo CommonWesley E Swan, LPCA, LCASA

## 2018-07-24 ENCOUNTER — Telehealth (HOSPITAL_COMMUNITY): Payer: Self-pay | Admitting: Licensed Clinical Social Worker

## 2018-07-24 NOTE — Telephone Encounter (Signed)
Called to inquire about pt's absence at last night's Aftercare group. Pt did not answer. Left message asking for return call.

## 2018-07-29 ENCOUNTER — Ambulatory Visit: Payer: Self-pay | Admitting: Psychiatry

## 2018-10-13 ENCOUNTER — Ambulatory Visit: Payer: PRIVATE HEALTH INSURANCE | Admitting: Clinical

## 2018-10-20 ENCOUNTER — Ambulatory Visit (INDEPENDENT_AMBULATORY_CARE_PROVIDER_SITE_OTHER): Payer: PRIVATE HEALTH INSURANCE | Admitting: Clinical

## 2018-10-20 DIAGNOSIS — F603 Borderline personality disorder: Secondary | ICD-10-CM | POA: Diagnosis not present

## 2018-10-30 ENCOUNTER — Ambulatory Visit (INDEPENDENT_AMBULATORY_CARE_PROVIDER_SITE_OTHER): Payer: PRIVATE HEALTH INSURANCE | Admitting: Clinical

## 2018-10-30 DIAGNOSIS — F603 Borderline personality disorder: Secondary | ICD-10-CM

## 2018-11-20 ENCOUNTER — Ambulatory Visit (INDEPENDENT_AMBULATORY_CARE_PROVIDER_SITE_OTHER): Payer: PRIVATE HEALTH INSURANCE | Admitting: Clinical

## 2018-11-20 ENCOUNTER — Other Ambulatory Visit: Payer: Self-pay

## 2018-11-20 DIAGNOSIS — F603 Borderline personality disorder: Secondary | ICD-10-CM | POA: Diagnosis not present

## 2018-12-31 ENCOUNTER — Ambulatory Visit: Payer: PRIVATE HEALTH INSURANCE | Admitting: Clinical

## 2019-04-18 ENCOUNTER — Ambulatory Visit (INDEPENDENT_AMBULATORY_CARE_PROVIDER_SITE_OTHER): Payer: PRIVATE HEALTH INSURANCE | Admitting: Clinical

## 2019-04-18 DIAGNOSIS — F603 Borderline personality disorder: Secondary | ICD-10-CM | POA: Diagnosis not present

## 2019-05-24 ENCOUNTER — Ambulatory Visit (INDEPENDENT_AMBULATORY_CARE_PROVIDER_SITE_OTHER): Payer: PRIVATE HEALTH INSURANCE | Admitting: Clinical

## 2019-05-24 DIAGNOSIS — F603 Borderline personality disorder: Secondary | ICD-10-CM | POA: Diagnosis not present

## 2023-05-20 ENCOUNTER — Ambulatory Visit: Payer: 59 | Attending: Cardiology | Admitting: Cardiology
